# Patient Record
Sex: Male | Born: 1985 | Race: Black or African American | Hispanic: No | Marital: Single | Smoking: Never smoker
Health system: Southern US, Community
[De-identification: ages and names within clinical notes are randomized; demographics above are authoritative.]

---
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
## 2008-01-05 ENCOUNTER — Emergency Department (HOSPITAL_COMMUNITY): Admission: EM | Admit: 2008-01-05 | Discharge: 2008-01-06 | Payer: Self-pay | Admitting: Emergency Medicine

## 2008-01-09 ENCOUNTER — Emergency Department (HOSPITAL_COMMUNITY): Admission: EM | Admit: 2008-01-09 | Discharge: 2008-01-09 | Payer: Self-pay | Admitting: Emergency Medicine

## 2008-07-13 ENCOUNTER — Emergency Department (HOSPITAL_COMMUNITY): Admission: EM | Admit: 2008-07-13 | Discharge: 2008-07-13 | Payer: Self-pay | Admitting: Emergency Medicine

## 2009-09-06 ENCOUNTER — Emergency Department (HOSPITAL_COMMUNITY): Admission: EM | Admit: 2009-09-06 | Discharge: 2009-09-06 | Payer: Self-pay | Admitting: Family Medicine

## 2009-11-17 ENCOUNTER — Emergency Department (HOSPITAL_COMMUNITY): Admission: EM | Admit: 2009-11-17 | Discharge: 2009-11-17 | Payer: Self-pay | Admitting: Emergency Medicine

## 2010-01-27 ENCOUNTER — Emergency Department (HOSPITAL_COMMUNITY): Admission: EM | Admit: 2010-01-27 | Discharge: 2010-01-27 | Payer: Self-pay | Admitting: Emergency Medicine

## 2010-02-04 ENCOUNTER — Emergency Department (HOSPITAL_COMMUNITY): Admission: EM | Admit: 2010-02-04 | Discharge: 2010-02-04 | Payer: Self-pay | Admitting: Emergency Medicine

## 2010-09-08 LAB — URINALYSIS, ROUTINE W REFLEX MICROSCOPIC
Bilirubin Urine: NEGATIVE
Glucose, UA: NEGATIVE mg/dL
Hgb urine dipstick: NEGATIVE
Ketones, ur: NEGATIVE mg/dL
Nitrite: NEGATIVE
Protein, ur: NEGATIVE mg/dL
Specific Gravity, Urine: 1.016 (ref 1.005–1.030)
Urobilinogen, UA: 1 mg/dL (ref 0.0–1.0)
pH: 8 (ref 5.0–8.0)

## 2010-09-08 LAB — URINE MICROSCOPIC-ADD ON

## 2010-09-17 LAB — POCT I-STAT, CHEM 8
Creatinine, Ser: 1.2 mg/dL (ref 0.4–1.5)
HCT: 47 % (ref 39.0–52.0)
Hemoglobin: 16 g/dL (ref 13.0–17.0)
Potassium: 3.9 mEq/L (ref 3.5–5.1)
Sodium: 140 mEq/L (ref 135–145)
TCO2: 30 mmol/L (ref 0–100)

## 2010-10-09 LAB — DIFFERENTIAL
Basophils Absolute: 0 10*3/uL (ref 0.0–0.1)
Basophils Relative: 1 % (ref 0–1)
Lymphocytes Relative: 24 % (ref 12–46)
Monocytes Relative: 11 % (ref 3–12)
Neutro Abs: 2.5 10*3/uL (ref 1.7–7.7)
Neutrophils Relative %: 64 % (ref 43–77)

## 2010-10-09 LAB — CBC
Hemoglobin: 15.9 g/dL (ref 13.0–17.0)
MCHC: 32.8 g/dL (ref 30.0–36.0)
RBC: 5.49 MIL/uL (ref 4.22–5.81)

## 2010-10-09 LAB — POCT URINALYSIS DIP (DEVICE)
Glucose, UA: 100 mg/dL — AB
Nitrite: NEGATIVE

## 2010-11-10 ENCOUNTER — Emergency Department (HOSPITAL_COMMUNITY)
Admission: EM | Admit: 2010-11-10 | Discharge: 2010-11-10 | Disposition: A | Discharge status: Home / Self Care | Payer: Self-pay | Attending: Emergency Medicine | Admitting: Emergency Medicine

## 2010-11-10 DIAGNOSIS — Z23 Encounter for immunization: Secondary | ICD-10-CM | POA: Insufficient documentation

## 2010-11-10 DIAGNOSIS — Z203 Contact with and (suspected) exposure to rabies: Secondary | ICD-10-CM | POA: Insufficient documentation

## 2011-09-20 ENCOUNTER — Emergency Department (HOSPITAL_COMMUNITY)
Admission: EM | Admit: 2011-09-20 | Discharge: 2011-09-21 | Disposition: A | Discharge status: Home / Self Care | Payer: Self-pay | Attending: Emergency Medicine | Admitting: Emergency Medicine

## 2011-09-20 ENCOUNTER — Encounter (HOSPITAL_COMMUNITY): Payer: Self-pay | Admitting: Emergency Medicine

## 2011-09-20 DIAGNOSIS — R112 Nausea with vomiting, unspecified: Secondary | ICD-10-CM

## 2011-09-20 DIAGNOSIS — R197 Diarrhea, unspecified: Secondary | ICD-10-CM | POA: Insufficient documentation

## 2011-09-20 MED ORDER — ONDANSETRON HCL 4 MG/2ML IJ SOLN
4.0000 mg | Freq: Once | INTRAMUSCULAR | Status: AC
  Administered 2011-09-20: 4 mg via INTRAVENOUS
  Filled 2011-09-20: qty 2

## 2011-09-20 MED ORDER — SODIUM CHLORIDE 0.9 % IV BOLUS (SEPSIS)
1000.0000 mL | Freq: Once | INTRAVENOUS | Status: AC
  Administered 2011-09-20: 1000 mL via INTRAVENOUS
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
## 2011-09-21 LAB — CBC
HCT: 46.1 % (ref 39.0–52.0)
Hemoglobin: 15.4 g/dL (ref 13.0–17.0)
MCH: 29 pg (ref 26.0–34.0)
MCHC: 33.4 g/dL (ref 30.0–36.0)
MCV: 86.8 fL (ref 78.0–100.0)
Platelets: 145 K/uL — ABNORMAL LOW (ref 150–400)
RBC: 5.31 MIL/uL (ref 4.22–5.81)
RDW: 13.2 % (ref 11.5–15.5)
WBC: 6.7 K/uL (ref 4.0–10.5)

## 2011-09-21 LAB — POCT I-STAT, CHEM 8
BUN: 15 mg/dL (ref 6–23)
Chloride: 99 mEq/L (ref 96–112)
Creatinine, Ser: 1.1 mg/dL (ref 0.50–1.35)
Glucose, Bld: 114 mg/dL — ABNORMAL HIGH (ref 70–99)
HCT: 50 % (ref 39.0–52.0)
Potassium: 3.4 mEq/L — ABNORMAL LOW (ref 3.5–5.1)

## 2011-09-21 LAB — DIFFERENTIAL
Lymphocytes Relative: 8 % — ABNORMAL LOW (ref 12–46)
Lymphs Abs: 0.6 10*3/uL — ABNORMAL LOW (ref 0.7–4.0)
Monocytes Relative: 6 % (ref 3–12)
Neutro Abs: 5.8 10*3/uL (ref 1.7–7.7)
Neutrophils Relative %: 86 % — ABNORMAL HIGH (ref 43–77)

## 2011-09-21 NOTE — Discharge Instructions (Signed)
B.R.A.T. Diet Your doctor has recommended the B.R.A.T. diet for you or your child until the condition improves. This is often used to help control diarrhea and vomiting symptoms. If you or your child can tolerate clear liquids, you may have:  Bananas.   Rice.   Applesauce.   Toast (and other simple starches such as crackers, potatoes, noodles).  Be sure to avoid dairy products, meats, and fatty foods until symptoms are better. Fruit juices such as apple, grape, and prune juice can make diarrhea worse. Avoid these. Continue this diet for 2 days or as instructed by your caregiver. Document Released: 06/11/2005 Document Revised: 05/31/2011 Document Reviewed: 11/28/2006 Exit[employer] Patient Information 2012 [employer]. 

## 2011-09-21 NOTE — ED Provider Notes (Signed)
History     CSN: [id number]  Arrival date & time 09/20/11  2030   First MD Initiated Contact with Patient 09/20/11 2332      Chief Complaint  Patient presents with  . Emesis    (Consider location/radiation/quality/duration/timing/severity/associated sxs/prior treatment) Patient is a 26 y.o. male presenting with vomiting and diarrhea. The history is provided by the patient. No language interpreter was used.  Emesis  This is a new problem. The current episode started 6 to 12 hours ago. The problem occurs 2 to 4 times per day. The problem has not changed since onset.The emesis has an appearance of stomach contents. There has been no fever. Associated symptoms include diarrhea. Pertinent negatives include no abdominal pain, no cough and no fever. Risk factors: unknown.  Diarrhea The primary symptoms include nausea, vomiting and diarrhea. Primary symptoms do not include fever or abdominal pain. The illness began yesterday. The onset was sudden. The problem has not changed since onset. The diarrhea began yesterday. The diarrhea is watery. The diarrhea occurs 5 to 10 times per day. Risk factors: unknown.  The illness does not include constipation, tenesmus or itching. Associated medical issues do not include inflammatory bowel disease. Risk factors: none.    History reviewed. No pertinent past medical history.  History reviewed. No pertinent past surgical history.  No family history on file.  History  Substance Use Topics  . Smoking status: Never Smoker   . Smokeless tobacco: Not on file  . Alcohol Use: No      Review of Systems  Constitutional: Negative.  Negative for fever.  HENT: Negative.  Negative for neck stiffness.   Eyes: Negative.   Respiratory: Negative.  Negative for cough and shortness of breath.   Cardiovascular: Negative.   Gastrointestinal: Positive for nausea, vomiting and diarrhea. Negative for abdominal pain and constipation.  Genitourinary: Negative.     Musculoskeletal: Negative.   Skin: Negative for itching.  Neurological: Negative.   Hematological: Negative.   Psychiatric/Behavioral: Negative.     Allergies  Review of patient's allergies indicates no known allergies.  Home Medications   Current Outpatient Rx  Name Route Sig Dispense Refill  . ACETAMINOPHEN 325 MG PO TABS Oral Take 650 mg by mouth every 6 (six) hours as needed. For fever      BP 114/65  Pulse 72  Temp(Src) 99.1 F (37.3 C) (Oral)  Resp 20  SpO2 100%  Physical Exam  Constitutional: He is oriented to person, place, and time. He appears well-developed and well-nourished. No distress.  HENT:  Head: Normocephalic and atraumatic.  Mouth/Throat: Oropharynx is clear and moist.  Eyes: Conjunctivae are normal. Pupils are equal, round, and reactive to light.  Neck: Normal range of motion. Neck supple.  Cardiovascular: Normal rate and regular rhythm.   Pulmonary/Chest: Effort normal and breath sounds normal. He has no wheezes. He has no rales.  Abdominal: Soft. Bowel sounds are normal. There is no tenderness. There is no rebound and no guarding.  Musculoskeletal: Normal range of motion.  Neurological: He is alert and oriented to person, place, and time.  Skin: Skin is warm and dry.  Psychiatric: He has a normal mood and affect.    ED Course  Procedures (including critical care time)  Labs Reviewed  CBC - Abnormal; Notable for the following:    Platelets 145 (*)    All other components within normal limits  DIFFERENTIAL - Abnormal; Notable for the following:    Neutrophils Relative 86 (*)    Lymphocytes  Relative 8 (*)    Lymphs Abs 0.6 (*)    All other components within normal limits  POCT I-STAT, CHEM 8 - Abnormal; Notable for the following:    Potassium 3.4 (*)    Glucose, Bld 114 (*)    All other components within normal limits   No results found.   No diagnosis found.    MDM  Return for fevers, chills, intractable vomiting, worsening pain  or any concerns        Shantell Belongia K Margarie Mcguirt-Rasch, MD 09/21/11 0202

## 2013-06-23 ENCOUNTER — Emergency Department (HOSPITAL_COMMUNITY): Payer: Self-pay

## 2013-06-23 ENCOUNTER — Encounter (HOSPITAL_COMMUNITY): Payer: Self-pay | Admitting: Emergency Medicine

## 2013-06-23 ENCOUNTER — Emergency Department (HOSPITAL_COMMUNITY)
Admission: EM | Admit: 2013-06-23 | Discharge: 2013-06-24 | Discharge status: Against Medical Advice | Payer: Self-pay | Attending: Emergency Medicine | Admitting: Emergency Medicine

## 2013-06-23 DIAGNOSIS — X500XXA Overexertion from strenuous movement or load, initial encounter: Secondary | ICD-10-CM | POA: Insufficient documentation

## 2013-06-23 DIAGNOSIS — IMO0002 Reserved for concepts with insufficient information to code with codable children: Secondary | ICD-10-CM | POA: Insufficient documentation

## 2013-06-23 DIAGNOSIS — Y929 Unspecified place or not applicable: Secondary | ICD-10-CM | POA: Insufficient documentation

## 2013-06-23 DIAGNOSIS — Y9389 Activity, other specified: Secondary | ICD-10-CM | POA: Insufficient documentation
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
## 2013-06-23 NOTE — ED Notes (Signed)
No answer not in the waiting room ?

## 2016-08-17 ENCOUNTER — Emergency Department
Admission: EM | Admit: 2016-08-17 | Discharge: 2016-08-17 | Attending: Student in an Organized Health Care Education/Training Program | Admitting: Student in an Organized Health Care Education/Training Program

## 2016-08-17 ENCOUNTER — Encounter: Payer: Self-pay | Admitting: Emergency Medicine

## 2016-08-17 ENCOUNTER — Emergency Department

## 2016-08-17 DIAGNOSIS — Y939 Activity, unspecified: Secondary | ICD-10-CM | POA: Insufficient documentation

## 2016-08-17 DIAGNOSIS — R0789 Other chest pain: Secondary | ICD-10-CM | POA: Diagnosis not present

## 2016-08-17 DIAGNOSIS — Y92149 Unspecified place in prison as the place of occurrence of the external cause: Secondary | ICD-10-CM | POA: Diagnosis not present

## 2016-08-17 DIAGNOSIS — M542 Cervicalgia: Secondary | ICD-10-CM | POA: Insufficient documentation

## 2016-08-17 DIAGNOSIS — F0781 Postconcussional syndrome: Secondary | ICD-10-CM

## 2016-08-17 DIAGNOSIS — Y999 Unspecified external cause status: Secondary | ICD-10-CM | POA: Insufficient documentation

## 2016-08-17 DIAGNOSIS — K6389 Other specified diseases of intestine: Secondary | ICD-10-CM | POA: Diagnosis not present

## 2016-08-17 DIAGNOSIS — G9340 Encephalopathy, unspecified: Secondary | ICD-10-CM | POA: Insufficient documentation

## 2016-08-17 DIAGNOSIS — S0083XA Contusion of other part of head, initial encounter: Secondary | ICD-10-CM | POA: Diagnosis not present

## 2016-08-17 DIAGNOSIS — T07XXXA Unspecified multiple injuries, initial encounter: Secondary | ICD-10-CM

## 2016-08-17 DIAGNOSIS — S0990XA Unspecified injury of head, initial encounter: Secondary | ICD-10-CM | POA: Diagnosis present

## 2016-08-17 LAB — COMPREHENSIVE METABOLIC PANEL
ALBUMIN: 4.5 g/dL (ref 3.5–5.0)
ALK PHOS: 41 U/L (ref 38–126)
ALT: 36 U/L (ref 17–63)
AST: 64 U/L — AB (ref 15–41)
Anion gap: 9 (ref 5–15)
BUN: 14 mg/dL (ref 6–20)
CO2: 24 mmol/L (ref 22–32)
Calcium: 9.6 mg/dL (ref 8.9–10.3)
Chloride: 103 mmol/L (ref 101–111)
Creatinine, Ser: 1.73 mg/dL — ABNORMAL HIGH (ref 0.61–1.24)
GFR calc Af Amer: 59 mL/min — ABNORMAL LOW (ref >60)
GFR calc non Af Amer: 51 mL/min — ABNORMAL LOW (ref >60)
GLUCOSE: 205 mg/dL — AB (ref 65–99)
Potassium: 3.6 mmol/L (ref 3.5–5.1)
Sodium: 136 mmol/L (ref 135–145)
TOTAL PROTEIN: 8.8 g/dL — AB (ref 6.5–8.1)
Total Bilirubin: 0.9 mg/dL (ref 0.3–1.2)

## 2016-08-17 LAB — CBC WITH DIFFERENTIAL/PLATELET
BASOS ABS: 0 10*3/uL (ref 0–0.1)
BASOS PCT: 0 %
Eosinophils Absolute: 0 10*3/uL (ref 0–0.7)
Eosinophils Relative: 0 %
HEMATOCRIT: 45.6 % (ref 40.0–52.0)
HEMOGLOBIN: 15.5 g/dL (ref 13.0–18.0)
Lymphocytes Relative: 10 %
Lymphs Abs: 1.6 10*3/uL (ref 1.0–3.6)
MCH: 29.1 pg (ref 26.0–34.0)
MCHC: 34 g/dL (ref 32.0–36.0)
MCV: 85.5 fL (ref 80.0–100.0)
Monocytes Absolute: 0.6 10*3/uL (ref 0.2–1.0)
Monocytes Relative: 4 %
NEUTROS ABS: 12.9 10*3/uL — AB (ref 1.4–6.5)
NEUTROS PCT: 86 %
Platelets: 221 10*3/uL (ref 150–440)
RBC: 5.33 MIL/uL (ref 4.40–5.90)
RDW: 13.5 % (ref 11.5–14.5)
WBC: 15.1 10*3/uL — ABNORMAL HIGH (ref 3.8–10.6)

## 2016-08-17 LAB — ETHANOL

## 2016-08-17 LAB — TYPE AND SCREEN
ABO/RH(D): O POS
Antibody Screen: NEGATIVE

## 2016-08-17 MED ORDER — IOPAMIDOL (ISOVUE-300) INJECTION 61%
100.0000 mL | Freq: Once | INTRAVENOUS | Status: AC | PRN
  Administered 2016-08-17: 100 mL via INTRAVENOUS
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
## 2016-08-17 NOTE — ED Notes (Signed)
[facility]
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
